# Patient Record
Sex: Female | Born: 1980 | Race: Black or African American | Hispanic: No | Marital: Single | Smoking: Never smoker
Health system: Southern US, Community
[De-identification: ages and names within clinical notes are randomized; demographics above are authoritative.]

---

## 2000-04-03 ENCOUNTER — Emergency Department (HOSPITAL_COMMUNITY): Admission: EM | Admit: 2000-04-03 | Discharge: 2000-04-03 | Payer: Self-pay | Admitting: Emergency Medicine

## 2000-04-03 ENCOUNTER — Encounter: Payer: Self-pay | Admitting: Emergency Medicine

## 2001-06-01 ENCOUNTER — Inpatient Hospital Stay (HOSPITAL_COMMUNITY): Admission: AD | Admit: 2001-06-01 | Discharge: 2001-06-01 | Payer: Self-pay | Admitting: *Deleted

## 2001-07-20 ENCOUNTER — Inpatient Hospital Stay (HOSPITAL_COMMUNITY): Admission: AD | Admit: 2001-07-20 | Discharge: 2001-07-20 | Payer: Self-pay | Admitting: Obstetrics and Gynecology

## 2001-07-20 ENCOUNTER — Encounter: Payer: Self-pay | Admitting: Obstetrics and Gynecology

## 2001-07-21 ENCOUNTER — Inpatient Hospital Stay (HOSPITAL_COMMUNITY): Admission: AD | Admit: 2001-07-21 | Discharge: 2001-07-24 | Payer: Self-pay | Admitting: *Deleted

## 2002-12-14 ENCOUNTER — Other Ambulatory Visit: Admission: RE | Admit: 2002-12-14 | Discharge: 2002-12-14 | Payer: Self-pay | Admitting: Obstetrics and Gynecology

## 2003-01-05 ENCOUNTER — Emergency Department (HOSPITAL_COMMUNITY): Admission: EM | Admit: 2003-01-05 | Discharge: 2003-01-06 | Payer: Self-pay | Admitting: Emergency Medicine

## 2004-09-24 ENCOUNTER — Other Ambulatory Visit: Admission: RE | Admit: 2004-09-24 | Discharge: 2004-09-24 | Payer: Self-pay | Admitting: Obstetrics and Gynecology

## 2005-10-22 ENCOUNTER — Emergency Department (HOSPITAL_COMMUNITY): Admission: EM | Admit: 2005-10-22 | Discharge: 2005-10-22 | Payer: Self-pay | Admitting: Family Medicine

## 2005-12-16 ENCOUNTER — Emergency Department (HOSPITAL_COMMUNITY): Admission: EM | Admit: 2005-12-16 | Discharge: 2005-12-16 | Payer: Self-pay | Admitting: Family Medicine

## 2006-02-16 ENCOUNTER — Other Ambulatory Visit: Admission: RE | Admit: 2006-02-16 | Discharge: 2006-02-16 | Payer: Self-pay | Admitting: Obstetrics and Gynecology

## 2007-05-22 ENCOUNTER — Other Ambulatory Visit: Admission: RE | Admit: 2007-05-22 | Discharge: 2007-05-22 | Payer: Self-pay | Admitting: Gynecology

## 2008-06-10 ENCOUNTER — Ambulatory Visit (HOSPITAL_COMMUNITY): Admission: RE | Admit: 2008-06-10 | Discharge: 2008-06-10 | Payer: Self-pay | Admitting: Obstetrics and Gynecology

## 2008-07-01 ENCOUNTER — Ambulatory Visit (HOSPITAL_COMMUNITY): Admission: RE | Admit: 2008-07-01 | Discharge: 2008-07-01 | Payer: Self-pay | Admitting: Obstetrics and Gynecology

## 2008-11-14 ENCOUNTER — Inpatient Hospital Stay (HOSPITAL_COMMUNITY): Admission: AD | Admit: 2008-11-14 | Discharge: 2008-11-14 | Payer: Self-pay | Admitting: Obstetrics and Gynecology

## 2008-11-14 ENCOUNTER — Emergency Department (HOSPITAL_COMMUNITY): Admission: EM | Admit: 2008-11-14 | Discharge: 2008-11-14 | Payer: Self-pay | Admitting: Family Medicine

## 2008-11-18 ENCOUNTER — Inpatient Hospital Stay (HOSPITAL_COMMUNITY): Admission: RE | Admit: 2008-11-18 | Discharge: 2008-11-20 | Payer: Self-pay | Admitting: Obstetrics and Gynecology

## 2009-10-18 IMAGING — US US OB DETAIL+14 WK
1 series · 14 of 28 positions shown · non-contrast
Comparison: none

OBSTETRICAL ULTRASOUND:
 This ultrasound was performed in The [HOSPITAL], and the [facility] report will be stored to [REDACTED] PACS.

[Series 1: us ob detail+14 wk · 14 of 60 slices shown]
[im 3/60]
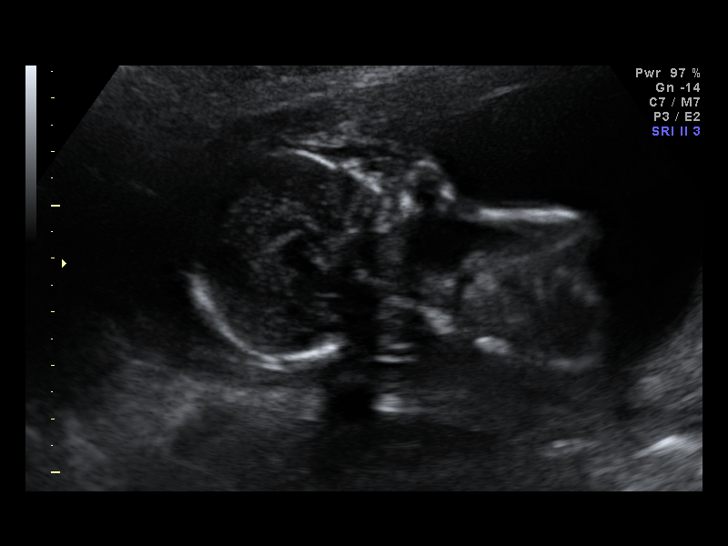
[im 7/60]
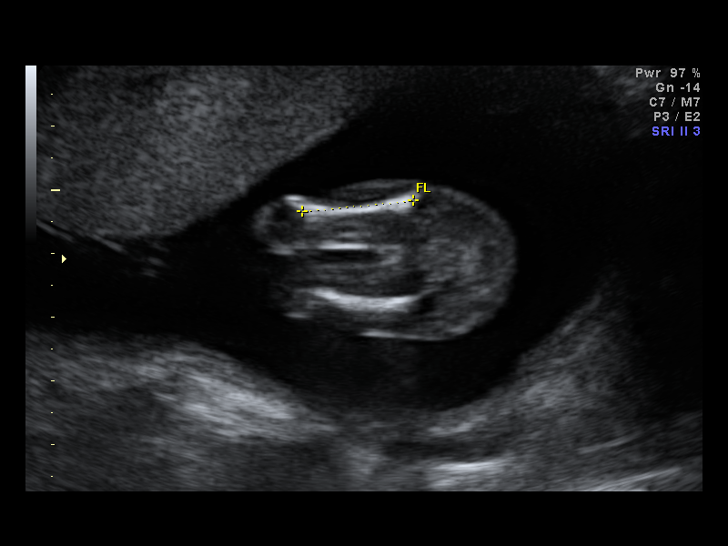
[im 11/60]
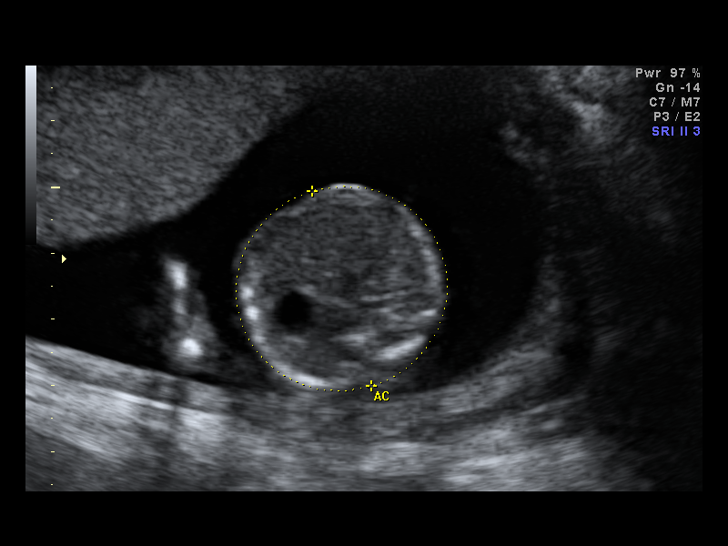
[im 16/60]
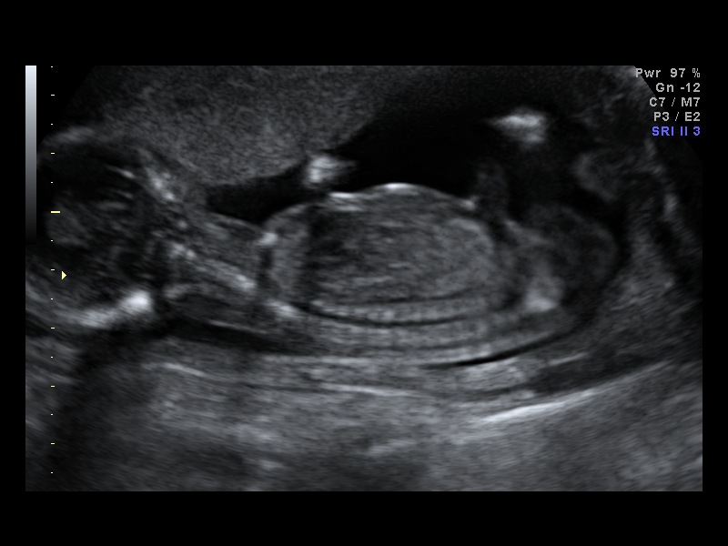
[im 20/60]
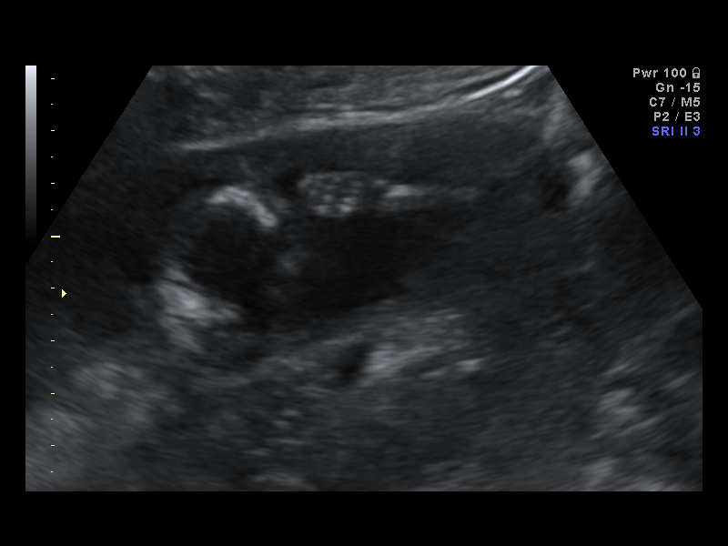
[im 25/60]
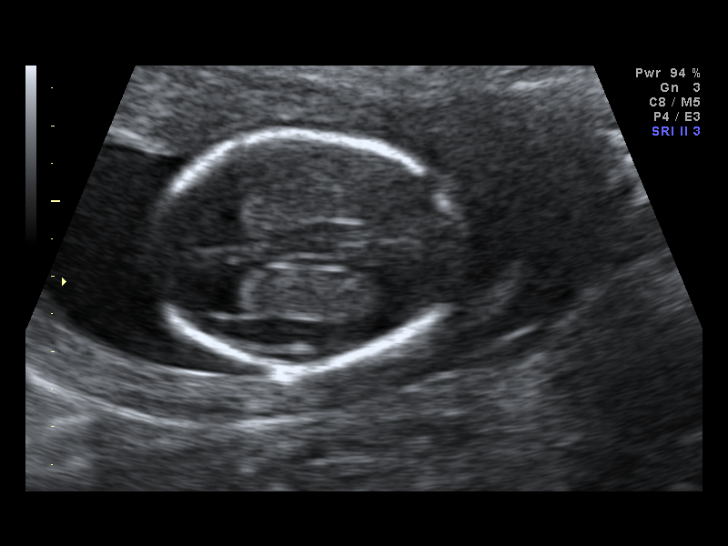
[im 29/60]
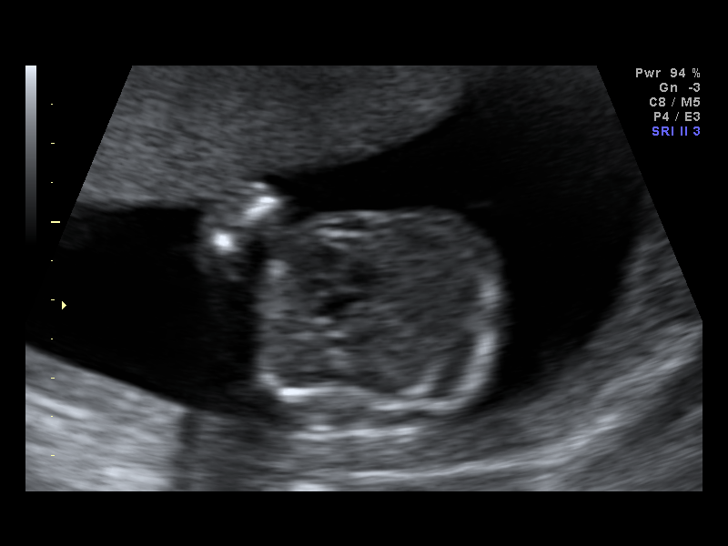
[im 33/60]
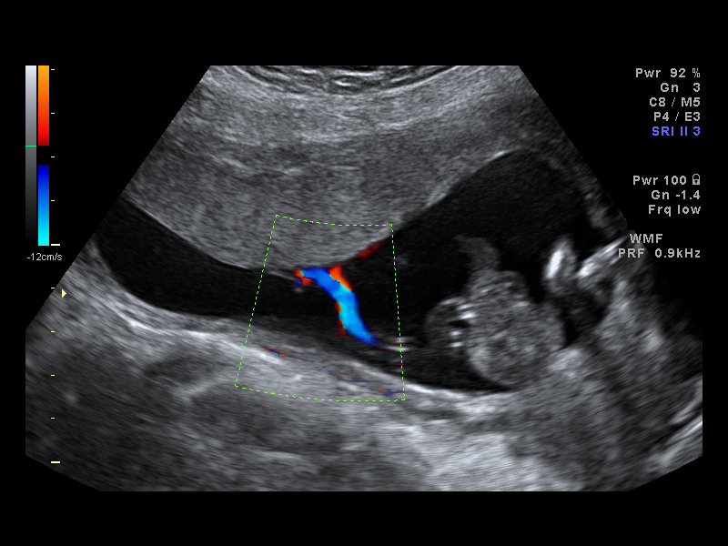
[im 38/60]
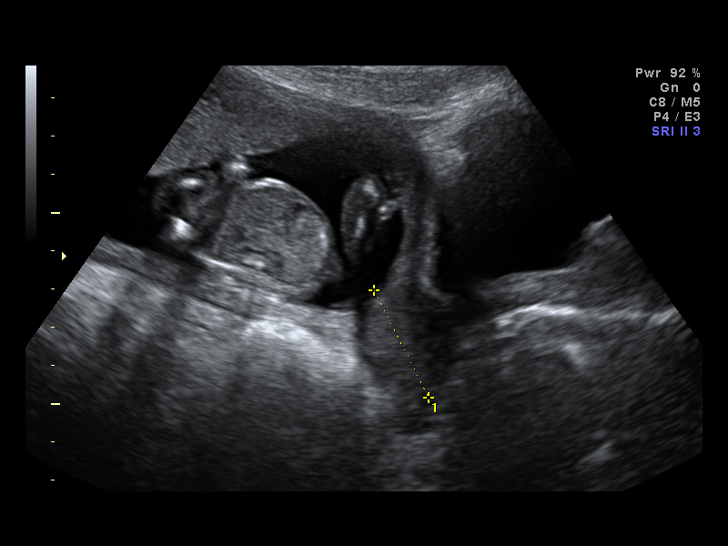
[im 42/60]
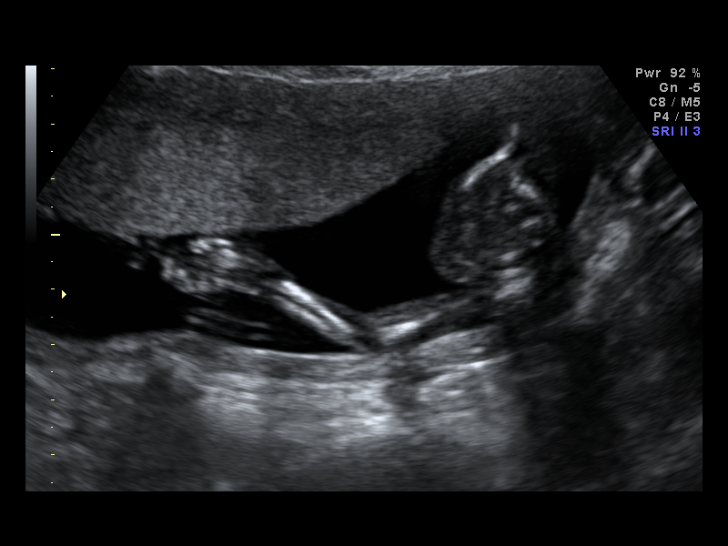
[im 46/60]
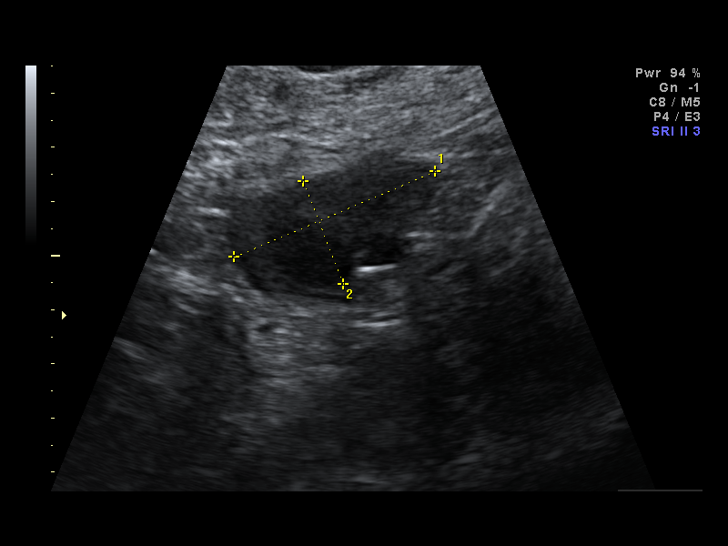
[im 51/60]
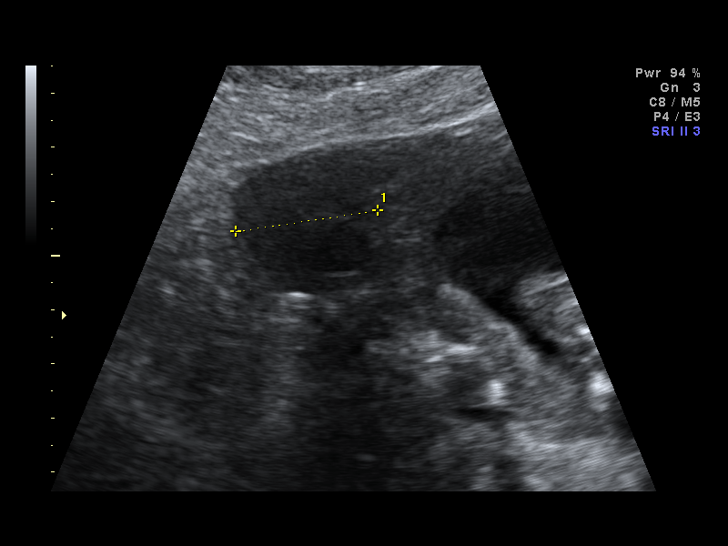
[im 55/60]
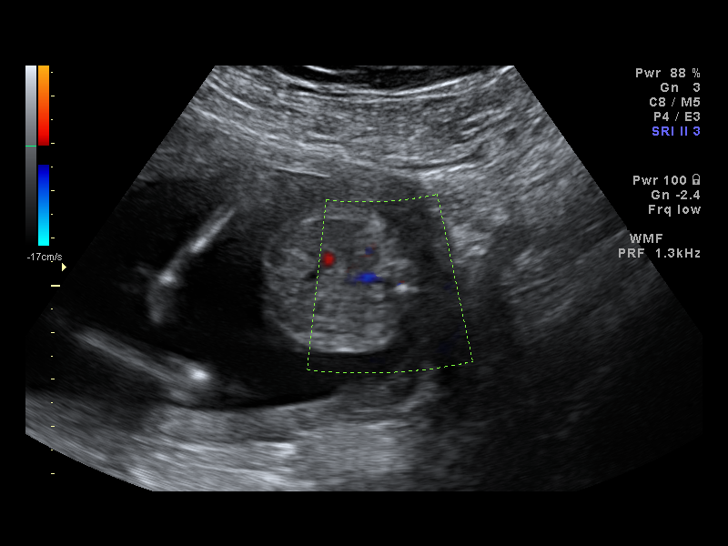
[im 60/60]
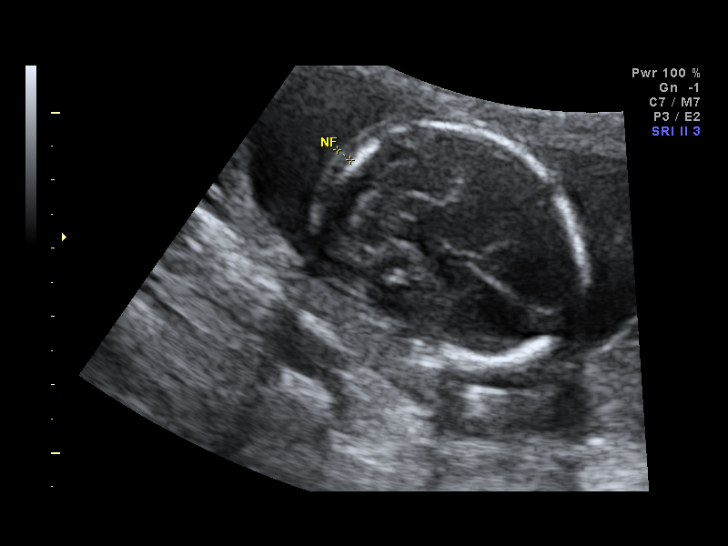

[14 of 28 positions shown; findings below may reference images not displayed]

IMPRESSION: [facility] has also been faxed to the ordering physician.

## 2009-11-20 ENCOUNTER — Emergency Department (HOSPITAL_COMMUNITY): Admission: EM | Admit: 2009-11-20 | Discharge: 2009-11-20 | Payer: Self-pay | Admitting: Family Medicine

## 2010-11-22 NOTE — L&D Delivery Note (Signed)
Delivery Note At 5:05 PM a viable female was delivered via Vaginal, Spontaneous Delivery (Presentation: Left Occiput Transverse).  APGAR: 8, 9; weight 8 lb 14 oz (4026 g).   Placenta status: Intact, Spontaneous.  Cord: 3 vessels with the following complications: None.  Cord pH: N/a  Bilateral labial and perineal abrasions.  Hemostatic.  Anesthesia: Epidural  Episiotomy: None Lacerations: None Suture Repair: N/a Est. Blood Loss (mL): Less than 250 ml  Mom to OR.  Baby to nursery-stable.  Geryl Rankins 10/18/2011, 6:52 PM

## 2010-12-14 ENCOUNTER — Encounter: Payer: Self-pay | Admitting: Obstetrics and Gynecology

## 2011-02-22 LAB — POCT PREGNANCY, URINE: Preg Test, Ur: NEGATIVE

## 2011-02-22 LAB — POCT URINALYSIS DIP (DEVICE)
Glucose, UA: NEGATIVE mg/dL
Nitrite: POSITIVE — AB
Protein, ur: 100 mg/dL — AB
Specific Gravity, Urine: 1.01 (ref 1.005–1.030)
Urobilinogen, UA: >=8 mg/dL (ref 0.0–1.0)
pH: 6 (ref 5.0–8.0)

## 2011-03-22 ENCOUNTER — Other Ambulatory Visit: Payer: Self-pay | Admitting: Obstetrics and Gynecology

## 2011-03-22 ENCOUNTER — Other Ambulatory Visit (HOSPITAL_COMMUNITY)
Admission: RE | Admit: 2011-03-22 | Discharge: 2011-03-22 | Disposition: A | Discharge status: Home / Self Care | Payer: 59 | Source: Ambulatory Visit | Attending: Obstetrics and Gynecology | Admitting: Obstetrics and Gynecology

## 2011-03-22 DIAGNOSIS — Z01419 Encounter for gynecological examination (general) (routine) without abnormal findings: Secondary | ICD-10-CM | POA: Insufficient documentation

## 2011-03-22 DIAGNOSIS — Z113 Encounter for screening for infections with a predominantly sexual mode of transmission: Secondary | ICD-10-CM | POA: Insufficient documentation

## 2011-05-27 LAB — ABO/RH

## 2011-05-27 LAB — GC/CHLAMYDIA PROBE AMP, GENITAL
Chlamydia: NEGATIVE
Gonorrhea: NEGATIVE

## 2011-05-27 LAB — HIV ANTIBODY (ROUTINE TESTING W REFLEX): HIV: NONREACTIVE

## 2011-05-27 LAB — HEPATITIS B SURFACE ANTIGEN: Hepatitis B Surface Ag: NEGATIVE

## 2011-05-27 LAB — RPR: RPR: NONREACTIVE

## 2011-08-26 LAB — CBC
HCT: 33.3 % — ABNORMAL LOW (ref 36.0–46.0)
Hemoglobin: 10.7 g/dL — ABNORMAL LOW (ref 12.0–15.0)
Hemoglobin: 11.3 g/dL — ABNORMAL LOW (ref 12.0–15.0)
MCHC: 34.3 g/dL (ref 30.0–36.0)
MCV: 93.8 fL (ref 78.0–100.0)
RBC: 3.32 MIL/uL — ABNORMAL LOW (ref 3.87–5.11)
RDW: 15.2 % (ref 11.5–15.5)
WBC: 11.8 10*3/uL — ABNORMAL HIGH (ref 4.0–10.5)
WBC: 13.5 10*3/uL — ABNORMAL HIGH (ref 4.0–10.5)

## 2011-10-13 ENCOUNTER — Other Ambulatory Visit: Payer: Self-pay | Admitting: Obstetrics and Gynecology

## 2011-10-17 ENCOUNTER — Encounter (HOSPITAL_COMMUNITY): Payer: Self-pay | Admitting: Pharmacist

## 2011-10-18 ENCOUNTER — Encounter (HOSPITAL_COMMUNITY): Payer: Self-pay | Admitting: Anesthesiology

## 2011-10-18 ENCOUNTER — Inpatient Hospital Stay (HOSPITAL_COMMUNITY): Payer: 59 | Admitting: Anesthesiology

## 2011-10-18 ENCOUNTER — Other Ambulatory Visit: Payer: Self-pay | Admitting: Obstetrics and Gynecology

## 2011-10-18 ENCOUNTER — Encounter (HOSPITAL_COMMUNITY)
Admission: RE | Disposition: A | Discharge status: Home / Self Care | Payer: Self-pay | Source: Ambulatory Visit | Attending: Obstetrics and Gynecology

## 2011-10-18 ENCOUNTER — Encounter (HOSPITAL_COMMUNITY): Payer: Self-pay

## 2011-10-18 ENCOUNTER — Inpatient Hospital Stay (HOSPITAL_COMMUNITY)
Admission: RE | Admit: 2011-10-18 | Discharge: 2011-10-20 | DRG: 767 | Disposition: A | Discharge status: Home / Self Care | Payer: 59 | Source: Ambulatory Visit | Attending: Obstetrics and Gynecology | Admitting: Obstetrics and Gynecology

## 2011-10-18 DIAGNOSIS — O99892 Other specified diseases and conditions complicating childbirth: Principal | ICD-10-CM | POA: Diagnosis present

## 2011-10-18 DIAGNOSIS — Z9851 Tubal ligation status: Secondary | ICD-10-CM

## 2011-10-18 DIAGNOSIS — Z302 Encounter for sterilization: Secondary | ICD-10-CM

## 2011-10-18 DIAGNOSIS — Z22330 Carrier of Group B streptococcus: Secondary | ICD-10-CM

## 2011-10-18 HISTORY — PX: TUBAL LIGATION: SHX77

## 2011-10-18 LAB — CBC
Hemoglobin: 11.3 g/dL — ABNORMAL LOW (ref 12.0–15.0)
MCV: 90.5 fL (ref 78.0–100.0)
Platelets: 189 10*3/uL (ref 150–400)
RBC: 3.67 MIL/uL — ABNORMAL LOW (ref 3.87–5.11)
WBC: 9.9 10*3/uL (ref 4.0–10.5)

## 2011-10-18 SURGERY — LIGATION, FALLOPIAN TUBE, POSTPARTUM
Anesthesia: Epidural | Laterality: Bilateral

## 2011-10-18 MED ORDER — IBUPROFEN 600 MG PO TABS: 600.0000 mg | ORAL_TABLET | Freq: Four times a day (QID) | ORAL | Status: DC | PRN

## 2011-10-18 MED ORDER — OXYCODONE-ACETAMINOPHEN 5-325 MG PO TABS
1.0000 | ORAL_TABLET | ORAL | Status: DC | PRN
Start: 2011-10-18 — End: 2011-10-20
  Administered 2011-10-18 (×2): 1 via ORAL
  Administered 2011-10-19 – 2011-10-20 (×4): 2 via ORAL
  Filled 2011-10-18: qty 1
  Filled 2011-10-18: qty 2
  Filled 2011-10-18: qty 1
  Filled 2011-10-18 (×3): qty 2

## 2011-10-18 MED ORDER — PHENYLEPHRINE 40 MCG/ML (10ML) SYRINGE FOR IV PUSH (FOR BLOOD PRESSURE SUPPORT)
80.0000 ug | PREFILLED_SYRINGE | INTRAVENOUS | Status: DC | PRN
  Filled 2011-10-18: qty 5

## 2011-10-18 MED ORDER — MEASLES, MUMPS & RUBELLA VAC ~~LOC~~ INJ: 0.5000 mL | INJECTION | Freq: Once | SUBCUTANEOUS | Status: DC

## 2011-10-18 MED ORDER — OXYTOCIN 20 UNITS IN LACTATED RINGERS INFUSION - SIMPLE
1.0000 m[IU]/min | INTRAVENOUS | Status: DC
  Administered 2011-10-18: 2 m[IU]/min via INTRAVENOUS
  Administered 2011-10-18: 8 m[IU]/min via INTRAVENOUS
  Filled 2011-10-18: qty 1000

## 2011-10-18 MED ORDER — BENZOCAINE-MENTHOL 20-0.5 % EX AERO: 1.0000 "application " | INHALATION_SPRAY | CUTANEOUS | Status: DC | PRN

## 2011-10-18 MED ORDER — FENTANYL 2.5 MCG/ML BUPIVACAINE 1/10 % EPIDURAL INFUSION (WH - ANES)
14.0000 mL/h | INTRAMUSCULAR | Status: DC
  Administered 2011-10-18 (×2): 14 mL/h via EPIDURAL
  Filled 2011-10-18 (×2): qty 60

## 2011-10-18 MED ORDER — ACETAMINOPHEN 325 MG PO TABS: 325.0000 mg | ORAL_TABLET | ORAL | Status: DC | PRN

## 2011-10-18 MED ORDER — BUPIVACAINE HCL (PF) 0.25 % IJ SOLN
INTRAMUSCULAR | Status: DC | PRN
  Administered 2011-10-18: 10 mL

## 2011-10-18 MED ORDER — LIDOCAINE HCL 1.5 % IJ SOLN
INTRAMUSCULAR | Status: DC | PRN
  Administered 2011-10-18 (×2): 5 mL via EPIDURAL

## 2011-10-18 MED ORDER — ZOLPIDEM TARTRATE 5 MG PO TABS: 5.0000 mg | ORAL_TABLET | Freq: Every evening | ORAL | Status: DC | PRN

## 2011-10-18 MED ORDER — LIDOCAINE HCL (PF) 1 % IJ SOLN: 30.0000 mL | INTRAMUSCULAR | Status: DC | PRN

## 2011-10-18 MED ORDER — PROMETHAZINE HCL 25 MG/ML IJ SOLN: 6.2500 mg | INTRAMUSCULAR | Status: DC | PRN

## 2011-10-18 MED ORDER — TETANUS-DIPHTH-ACELL PERTUSSIS 5-2.5-18.5 LF-MCG/0.5 IM SUSP
0.5000 mL | Freq: Once | INTRAMUSCULAR | Status: AC
  Administered 2011-10-20: 0.5 mL via INTRAMUSCULAR
  Filled 2011-10-18: qty 0.5

## 2011-10-18 MED ORDER — FAMOTIDINE 20 MG PO TABS
20.0000 mg | ORAL_TABLET | Freq: Two times a day (BID) | ORAL | Status: DC
  Administered 2011-10-18 – 2011-10-20 (×3): 20 mg via ORAL
  Filled 2011-10-18 (×3): qty 1

## 2011-10-18 MED ORDER — PROMETHAZINE HCL 25 MG/ML IJ SOLN
INTRAMUSCULAR | Status: AC
  Filled 2011-10-18: qty 1

## 2011-10-18 MED ORDER — EPHEDRINE 5 MG/ML INJ: 10.0000 mg | INTRAVENOUS | Status: DC | PRN

## 2011-10-18 MED ORDER — FLEET ENEMA 7-19 GM/118ML RE ENEM: 1.0000 | ENEMA | RECTAL | Status: DC | PRN

## 2011-10-18 MED ORDER — LACTATED RINGERS IV SOLN
500.0000 mL | Freq: Once | INTRAVENOUS | Status: AC
  Administered 2011-10-18: 500 mL via INTRAVENOUS

## 2011-10-18 MED ORDER — DIPHENHYDRAMINE HCL 25 MG PO CAPS: 25.0000 mg | ORAL_CAPSULE | Freq: Four times a day (QID) | ORAL | Status: DC | PRN

## 2011-10-18 MED ORDER — ONDANSETRON HCL 4 MG/2ML IJ SOLN: 4.0000 mg | INTRAMUSCULAR | Status: DC | PRN

## 2011-10-18 MED ORDER — SODIUM BICARBONATE 8.4 % IV SOLN
INTRAVENOUS | Status: AC
  Filled 2011-10-18: qty 50

## 2011-10-18 MED ORDER — PENICILLIN G POTASSIUM 5000000 UNITS IJ SOLR
2.5000 10*6.[IU] | INTRAVENOUS | Status: DC
  Administered 2011-10-18 (×2): 2.5 10*6.[IU] via INTRAVENOUS
  Filled 2011-10-18 (×5): qty 2.5

## 2011-10-18 MED ORDER — MIDAZOLAM HCL 5 MG/5ML IJ SOLN
INTRAMUSCULAR | Status: DC | PRN
  Administered 2011-10-18 (×2): 1 mg via INTRAVENOUS

## 2011-10-18 MED ORDER — ONDANSETRON HCL 4 MG/2ML IJ SOLN
INTRAMUSCULAR | Status: DC | PRN
  Administered 2011-10-18: 4 mg via INTRAVENOUS

## 2011-10-18 MED ORDER — LACTATED RINGERS IV SOLN
INTRAVENOUS | Status: DC | PRN
  Administered 2011-10-18: 18:00:00 via INTRAVENOUS

## 2011-10-18 MED ORDER — CITRIC ACID-SODIUM CITRATE 334-500 MG/5ML PO SOLN
30.0000 mL | ORAL | Status: DC | PRN
  Administered 2011-10-18: 30 mL via ORAL
  Filled 2011-10-18: qty 15

## 2011-10-18 MED ORDER — OXYTOCIN 20 UNITS IN LACTATED RINGERS INFUSION - SIMPLE: 125.0000 mL/h | INTRAVENOUS | Status: DC | PRN

## 2011-10-18 MED ORDER — KETOROLAC TROMETHAMINE 30 MG/ML IJ SOLN
INTRAMUSCULAR | Status: AC
  Filled 2011-10-18: qty 1

## 2011-10-18 MED ORDER — MEPERIDINE HCL 25 MG/ML IJ SOLN
6.2500 mg | INTRAMUSCULAR | Status: DC | PRN
  Administered 2011-10-18: 6.25 mg via INTRAVENOUS

## 2011-10-18 MED ORDER — LIDOCAINE-EPINEPHRINE (PF) 2 %-1:200000 IJ SOLN
INTRAMUSCULAR | Status: AC
  Filled 2011-10-18: qty 20

## 2011-10-18 MED ORDER — SENNOSIDES-DOCUSATE SODIUM 8.6-50 MG PO TABS
2.0000 | ORAL_TABLET | Freq: Every day | ORAL | Status: DC
  Administered 2011-10-18 – 2011-10-19 (×2): 2 via ORAL

## 2011-10-18 MED ORDER — MEPERIDINE HCL 25 MG/ML IJ SOLN
INTRAMUSCULAR | Status: AC
  Filled 2011-10-18: qty 1

## 2011-10-18 MED ORDER — LACTATED RINGERS IV SOLN
INTRAVENOUS | Status: DC
Start: 2011-10-18 — End: 2011-10-18
  Administered 2011-10-18: 08:00:00 via INTRAVENOUS

## 2011-10-18 MED ORDER — MAGNESIUM HYDROXIDE 400 MG/5ML PO SUSP: 30.0000 mL | ORAL | Status: DC | PRN

## 2011-10-18 MED ORDER — IBUPROFEN 600 MG PO TABS
600.0000 mg | ORAL_TABLET | Freq: Four times a day (QID) | ORAL | Status: DC
  Administered 2011-10-19 – 2011-10-20 (×5): 600 mg via ORAL
  Filled 2011-10-18 (×6): qty 1

## 2011-10-18 MED ORDER — EPHEDRINE 5 MG/ML INJ
10.0000 mg | INTRAVENOUS | Status: DC | PRN
  Filled 2011-10-18: qty 4

## 2011-10-18 MED ORDER — LANOLIN HYDROUS EX OINT: TOPICAL_OINTMENT | CUTANEOUS | Status: DC | PRN

## 2011-10-18 MED ORDER — FERROUS SULFATE 325 (65 FE) MG PO TABS
325.0000 mg | ORAL_TABLET | Freq: Two times a day (BID) | ORAL | Status: DC
  Administered 2011-10-19 – 2011-10-20 (×3): 325 mg via ORAL
  Filled 2011-10-18 (×3): qty 1

## 2011-10-18 MED ORDER — BUPIVACAINE HCL (PF) 0.25 % IJ SOLN
INTRAMUSCULAR | Status: DC | PRN
  Administered 2011-10-18: 10 mL via EPIDURAL

## 2011-10-18 MED ORDER — OXYTOCIN BOLUS FROM INFUSION
500.0000 mL | Freq: Once | INTRAVENOUS | Status: DC
  Filled 2011-10-18: qty 500

## 2011-10-18 MED ORDER — TERBUTALINE SULFATE 1 MG/ML IJ SOLN: 0.2500 mg | Freq: Once | INTRAMUSCULAR | Status: DC | PRN

## 2011-10-18 MED ORDER — SIMETHICONE 80 MG PO CHEW
80.0000 mg | CHEWABLE_TABLET | ORAL | Status: DC | PRN
  Administered 2011-10-20: 80 mg via ORAL

## 2011-10-18 MED ORDER — SODIUM BICARBONATE 8.4 % IV SOLN
INTRAVENOUS | Status: DC | PRN
  Administered 2011-10-18: 10 mL via EPIDURAL

## 2011-10-18 MED ORDER — PRENATAL PLUS 27-1 MG PO TABS
1.0000 | ORAL_TABLET | Freq: Every day | ORAL | Status: DC
  Administered 2011-10-19 – 2011-10-20 (×2): 1 via ORAL
  Filled 2011-10-18 (×2): qty 1

## 2011-10-18 MED ORDER — CEFAZOLIN SODIUM 1-5 GM-% IV SOLN
INTRAVENOUS | Status: DC | PRN
  Administered 2011-10-18: 1 g via INTRAVENOUS

## 2011-10-18 MED ORDER — DIPHENHYDRAMINE HCL 50 MG/ML IJ SOLN: 12.5000 mg | INTRAMUSCULAR | Status: DC | PRN

## 2011-10-18 MED ORDER — DEXTROSE 5 % IV SOLN
5.0000 10*6.[IU] | Freq: Once | INTRAVENOUS | Status: AC
  Administered 2011-10-18: 5 10*6.[IU] via INTRAVENOUS
  Filled 2011-10-18: qty 5

## 2011-10-18 MED ORDER — KETOROLAC TROMETHAMINE 30 MG/ML IJ SOLN
15.0000 mg | Freq: Once | INTRAMUSCULAR | Status: AC | PRN
  Administered 2011-10-18: 30 mg via INTRAVENOUS

## 2011-10-18 MED ORDER — WITCH HAZEL-GLYCERIN EX PADS: 1.0000 "application " | MEDICATED_PAD | CUTANEOUS | Status: DC | PRN

## 2011-10-18 MED ORDER — ONDANSETRON HCL 4 MG PO TABS: 4.0000 mg | ORAL_TABLET | ORAL | Status: DC | PRN

## 2011-10-18 MED ORDER — LACTATED RINGERS IV SOLN: 500.0000 mL | INTRAVENOUS | Status: DC | PRN

## 2011-10-18 MED ORDER — ONDANSETRON HCL 4 MG/2ML IJ SOLN: 4.0000 mg | Freq: Four times a day (QID) | INTRAMUSCULAR | Status: DC | PRN

## 2011-10-18 MED ORDER — METHYLERGONOVINE MALEATE 0.2 MG/ML IJ SOLN: 0.2000 mg | INTRAMUSCULAR | Status: DC | PRN

## 2011-10-18 MED ORDER — MIDAZOLAM HCL 2 MG/2ML IJ SOLN
INTRAMUSCULAR | Status: AC
  Filled 2011-10-18: qty 2

## 2011-10-18 MED ORDER — OXYTOCIN 10 UNIT/ML IJ SOLN
INTRAMUSCULAR | Status: DC | PRN
  Administered 2011-10-18: 20 [IU] via INTRAMUSCULAR

## 2011-10-18 MED ORDER — FENTANYL CITRATE 0.05 MG/ML IJ SOLN
INTRAMUSCULAR | Status: AC
  Administered 2011-10-18: 50 ug
  Filled 2011-10-18: qty 2

## 2011-10-18 MED ORDER — OXYTOCIN 20 UNITS IN LACTATED RINGERS INFUSION - SIMPLE: 125.0000 mL/h | Freq: Once | INTRAVENOUS | Status: DC

## 2011-10-18 MED ORDER — METHYLERGONOVINE MALEATE 0.2 MG PO TABS: 0.2000 mg | ORAL_TABLET | ORAL | Status: DC | PRN

## 2011-10-18 MED ORDER — CEFAZOLIN SODIUM 1-5 GM-% IV SOLN
INTRAVENOUS | Status: AC
  Filled 2011-10-18: qty 50

## 2011-10-18 MED ORDER — DIBUCAINE 1 % RE OINT: 1.0000 "application " | TOPICAL_OINTMENT | RECTAL | Status: DC | PRN

## 2011-10-18 MED ORDER — FENTANYL CITRATE 0.05 MG/ML IJ SOLN: 25.0000 ug | INTRAMUSCULAR | Status: DC | PRN

## 2011-10-18 MED ORDER — ACETAMINOPHEN 325 MG PO TABS: 650.0000 mg | ORAL_TABLET | ORAL | Status: DC | PRN

## 2011-10-18 MED ORDER — PHENYLEPHRINE 40 MCG/ML (10ML) SYRINGE FOR IV PUSH (FOR BLOOD PRESSURE SUPPORT): 80.0000 ug | PREFILLED_SYRINGE | INTRAVENOUS | Status: DC | PRN

## 2011-10-18 MED ORDER — PROMETHAZINE HCL 25 MG/ML IJ SOLN
6.2500 mg | INTRAMUSCULAR | Status: DC | PRN
  Administered 2011-10-18: 6.25 mg via INTRAVENOUS

## 2011-10-18 MED ORDER — OXYCODONE-ACETAMINOPHEN 5-325 MG PO TABS: 2.0000 | ORAL_TABLET | ORAL | Status: DC | PRN

## 2011-10-18 MED ORDER — OXYTOCIN 10 UNIT/ML IJ SOLN
INTRAMUSCULAR | Status: AC
  Filled 2011-10-18: qty 2

## 2011-10-18 SURGICAL SUPPLY — 23 items
CHLORAPREP W/TINT 26ML (MISCELLANEOUS) ×2 IMPLANT
CLOTH BEACON ORANGE TIMEOUT ST (SAFETY) ×2 IMPLANT
DERMABOND ADVANCED (GAUZE/BANDAGES/DRESSINGS) ×1
DERMABOND ADVANCED .7 DNX12 (GAUZE/BANDAGES/DRESSINGS) ×1 IMPLANT
DRAPE UTILITY XL STRL (DRAPES) ×2 IMPLANT
GLOVE BIO SURGEON STRL SZ7 (GLOVE) ×2 IMPLANT
GLOVE BIOGEL PI IND STRL 7.0 (GLOVE) ×2 IMPLANT
GLOVE BIOGEL PI INDICATOR 7.0 (GLOVE) ×2
GOWN PREVENTION PLUS LG XLONG (DISPOSABLE) ×4 IMPLANT
NEEDLE HYPO 22GX1.5 SAFETY (NEEDLE) ×2 IMPLANT
NS IRRIG 1000ML POUR BTL (IV SOLUTION) ×2 IMPLANT
PACK ABDOMINAL MINOR (CUSTOM PROCEDURE TRAY) ×2 IMPLANT
PENCIL BUTTON HOLSTER BLD 10FT (ELECTRODE) ×2 IMPLANT
SLEEVE SCD COMPRESS KNEE LRG (MISCELLANEOUS) ×2 IMPLANT
SPONGE LAP 4X18 X RAY DECT (DISPOSABLE) IMPLANT
SUT MNCRL AB 4-0 PS2 18 (SUTURE) ×4 IMPLANT
SUT PLAIN 0 NONE (SUTURE) ×4 IMPLANT
SUT VIC AB 0 CT1 27 (SUTURE) ×1
SUT VIC AB 0 CT1 27XBRD ANBCTR (SUTURE) ×1 IMPLANT
SYR CONTROL 10ML LL (SYRINGE) ×2 IMPLANT
TOWEL OR 17X24 6PK STRL BLUE (TOWEL DISPOSABLE) ×4 IMPLANT
TRAY FOLEY CATH 14FR (SET/KITS/TRAYS/PACK) ×2 IMPLANT
WATER STERILE IRR 1000ML POUR (IV SOLUTION) ×2 IMPLANT

## 2011-10-18 NOTE — Progress Notes (Signed)
Anne Collins is a 30 y.o. G3P2002 at [redacted]w[redacted]d   Subjective: Pt getting more uncomfortable but does not require pain meds at this time.  Objective: BP 123/66  Pulse 83  Temp(Src) 97.7 F (36.5 C) (Oral)  Ht 5\' 5"  (1.651 m)  Wt 107.502 kg (237 lb)  BMI 39.44 kg/m2      FHT:  FHR: 140s bpm, variability: moderate,  accelerations:  Present,  decelerations:  Absent UC:   regular, every 3-4 minutes SVE:   Dilation: 4.5 Effacement (%): 70 Station: -2 Exam by:: Dr. Dion Body  AROM-minimal fluid Labs: Lab Results  Component Value Date   WBC 9.9 10/18/2011   HGB 11.3* 10/18/2011   HCT 33.2* 10/18/2011   MCV 90.5 10/18/2011   PLT 189 10/18/2011    Assessment / Plan: IOL, s/p AROM Pitocin. S/p 2 doses of PCN. Epidural per pt request.   Geryl Rankins 10/18/2011, 12:47 PM

## 2011-10-18 NOTE — Anesthesia Preprocedure Evaluation (Signed)
Anesthesia Evaluation  Patient identified by MRN, date of birth, ID band Patient awake    Reviewed: Allergy & Precautions, H&P , Patient's Chart, lab work & pertinent test results  Airway Mallampati: III TM Distance: >3 FB Neck ROM: full    Dental No notable dental hx.    Pulmonary neg pulmonary ROS,  clear to auscultation  Pulmonary exam normal       Cardiovascular neg cardio ROS regular Normal    Neuro/Psych Negative Neurological ROS  Negative Psych ROS   GI/Hepatic negative GI ROS, Neg liver ROS,   Endo/Other  Negative Endocrine ROSMorbid obesity  Renal/GU negative Renal ROS     Musculoskeletal   Abdominal   Peds  Hematology negative hematology ROS (+)   Anesthesia Other Findings   Reproductive/Obstetrics (+) Pregnancy                           Anesthesia Physical Anesthesia Plan  ASA: III  Anesthesia Plan: Epidural   Post-op Pain Management:    Induction:   Airway Management Planned:   Additional Equipment:   Intra-op Plan:   Post-operative Plan:   Informed Consent: I have reviewed the patients History and Physical, chart, labs and discussed the procedure including the risks, benefits and alternatives for the proposed anesthesia with the patient or authorized representative who has indicated his/her understanding and acceptance.     Plan Discussed with:   Anesthesia Plan Comments:         Anesthesia Quick Evaluation  

## 2011-10-18 NOTE — Transfer of Care (Signed)
Immediate Anesthesia Transfer of Care Note  Patient: Anne Collins  Procedure(s) Performed:  POST PARTUM TUBAL LIGATION  Patient Location: PACU  Anesthesia Type: Epidural  Level of Consciousness: awake  Airway & Oxygen Therapy: Patient Spontanous Breathing  Post-op Assessment: Report given to PACU RN  Post vital signs: Reviewed and stable  Complications: No apparent anesthesia complications

## 2011-10-18 NOTE — Anesthesia Postprocedure Evaluation (Signed)
  Anesthesia Post-op Note  Patient: Anne Collins   Patient is awake, responsive, moving her legs, and has signs of resolution of her numbness. Pain and nausea are reasonably well controlled. Vital signs are stable and clinically acceptable. Oxygen saturation is clinically acceptable. There are no apparent anesthetic complications at this time. Patient is ready for discharge.

## 2011-10-18 NOTE — Anesthesia Procedure Notes (Addendum)
Epidural Patient location during procedure: OB Start time: 10/18/2011 1:46 PM  Staffing Performed by: anesthesiologist   Preanesthetic Checklist Completed: patient identified, site marked, surgical consent, pre-op evaluation, timeout performed, IV checked, risks and benefits discussed and monitors and equipment checked  Epidural Patient position: sitting Prep: site prepped and draped and DuraPrep Patient monitoring: continuous pulse ox and blood pressure Approach: midline Injection technique: LOR air  Needle:  Needle type: Tuohy  Needle gauge: 17 G Needle length: 9 cm Needle insertion depth: 8 cm Catheter type: closed end flexible Catheter size: 19 Gauge Catheter at skin depth: 13 cm Test dose: negative  Assessment Events: blood not aspirated, injection not painful, no injection resistance, negative IV test and no paresthesia  Additional Notes Patient identified.  Risk benefits discussed including failed block, incomplete pain control, headache, nerve damage, paralysis, blood pressure changes, nausea, vomiting, reactions to medication both toxic or allergic, and postpartum back pain.  Patient expressed understanding and wished to proceed.  All questions were answered.  Sterile technique used throughout procedure and epidural site dressed with sterile barrier dressing. No paresthesia or other complications noted.The patient did not experience any signs of intravascular injection such as tinnitus or metallic taste in mouth nor signs of intrathecal spread such as rapid motor block. Please see nursing notes for vital signs.

## 2011-10-18 NOTE — Progress Notes (Signed)
NDIA SAMPATH is a 30 y.o. G3P2002 at [redacted]w[redacted]d   Subjective: Pt comfortable s/p epidural.   Objective: BP 112/69  Pulse 85  Temp(Src) 97.7 F (36.5 C) (Oral)  Ht 5\' 5"  (1.651 m)  Wt 107.502 kg (237 lb)  BMI 39.44 kg/m2  SpO2 100%      FHT:  FHR: 110s bpm, variability: moderate,  accelerations:  Present,  decelerations:  Present early , possible late UC:   regular, every 2 minutes per RN, difficulty to assess. Pt in right lateral decubitus. SVE:   Dilation: 7 Effacement (%): 90 Station: 0 Exam by:: [employer]  Labs: Lab Results  Component Value Date   WBC 9.9 10/18/2011   HGB 11.3* 10/18/2011   HCT 33.2* 10/18/2011   MCV 90.5 10/18/2011   PLT 189 10/18/2011    Assessment / Plan: Induction of labor due to term with favorable cervix and ,  progressing well on pitocin  Labor: Progressing normally Preeclampsia:  no signs or symptoms of toxicity Fetal Wellbeing:  Category I Pain Control:  Epidural I/D:  PCN Anticipated MOD:  NSVD  Brannon Decaire 10/18/2011, 3:38 PM

## 2011-10-18 NOTE — Progress Notes (Signed)
Anne Collins is a 30 y.o. G3P2002 at [redacted]w[redacted]d admitted for elective induction  Subjective:  Pt without complaints.  Feel contractions.  Feels pressure with contractions.   Objective: BP 114/66  Pulse 78  Temp(Src) 97.7 F (36.5 C) (Oral)  Ht 5\' 5"  (1.651 m)  Wt 107.502 kg (237 lb)  BMI 39.44 kg/m2  LMP 01/22/2011      FHT:  Reactive, no decels UC:   q 3-5 minutes  SVE:   Dilation: 4 Effacement (%): 70 Station: -3 Exam by:: Dr. Dion Body  Cervical exam deferred  Labs: Lab Results  Component Value Date   WBC 9.9 10/18/2011   HGB 11.3* 10/18/2011   HCT 33.2* 10/18/2011   MCV 90.5 10/18/2011   PLT 189 10/18/2011    Assessment / Plan: Induction of labor due to pt request,  progressing well on pitocin  Labor: Progressing normally Preeclampsia:  no signs or symptoms of toxicity Fetal Wellbeing:  Category I Pain Control:  None required at this time I/D:  PCN for GBS prophalaxis Anticipated MOD:  NSVD  [name] 10/18/2011, 10:45 AM

## 2011-10-18 NOTE — H&P (Signed)
Anne Collins is a 30 y.o. female presenting for induction of labor at 59 6/7 weeks.  Pt with occasional contractions but not regular.  Pt requests tubal ligation, preferably to be done prior to discharge. . Maternal Medical History:  Reason for admission: Induction of labor  Contractions: Frequency: irregular.    Fetal activity: Perceived fetal activity is normal.    Prenatal complications: no prenatal complications Prenatal Complications - Diabetes: none.    OB History    Grav Para Term Preterm Abortions TAB SAB Ect Mult Living   3 2 2  0 0 0 0 0 0 2     History reviewed. No pertinent past medical history. History reviewed. No pertinent past surgical history. Family History: family history is not on file. Social History:  reports that she has never smoked. She has never used smokeless tobacco. She reports that she does not drink alcohol or use illicit drugs.  Review of Systems  Gastrointestinal: Positive for abdominal pain.    Dilation: 4 Effacement (%): 70 Station: -3 Exam by:: Dr. Dion Body  Blood pressure 127/75, pulse 102, temperature 97.7 F (36.5 C), temperature source Oral, height 5\' 5"  (1.651 m), weight 107.502 kg (237 lb), last menstrual period 01/22/2011. Maternal Exam:  Uterine Assessment: Contraction strength is mild.  Contraction frequency is irregular.   Abdomen: Estimated fetal weight is 7.5-8 pounds.   Fetal presentation: vertex  Introitus: Normal vulva. Ferning test: not done.  Nitrazine test: not done.  Pelvis: adequate for delivery.   Cervix: Cervix evaluated by digital exam.     Physical Exam  Constitutional: She is oriented to person, place, and time. She appears well-developed and well-nourished.  HENT:  Head: Normocephalic and atraumatic.  Eyes: Conjunctivae and EOM are normal.  Neck: Normal range of motion.  GI: There is no tenderness.  Genitourinary: Uterus normal.  Neurological: She is alert and oriented to person, place, and time.    Skin: Skin is warm and dry.  Psychiatric: She has a normal mood and affect.    Hg 11.7  Prenatal labs: ABO, Rh: O/Positive/-- (07/05 0000) Antibody: Negative (07/05 0000) Rubella: Immune (07/05 0000) RPR: Nonreactive (07/05 0000)  HBsAg: Negative (07/05 0000)  HIV: Non-reactive (07/05 0000)  GBS: Positive (10/26 0000)   Assessment/Plan: Term pregnancy at 39+ weeks for elective induction. IV Pitocin. GBS+ PCN for prophylaxis. Postpartum BTL after delivery.  Need to precert insurance.  Meital Riehl 10/18/2011, 9:02 AM

## 2011-10-18 NOTE — Brief Op Note (Addendum)
10/18/2011  6:54 PM  PATIENT:  Anne Collins  30 y.o. female  PRE-OPERATIVE DIAGNOSIS:  Desires Sterilization  POST-OPERATIVE DIAGNOSIS:  Desires Sterilization  PROCEDURE:  Procedure(s): POST PARTUM TUBAL LIGATION via Engineer, civil (consulting) method  SURGEON:  Surgeon(s): Geryl Rankins, MD  PHYSICIAN ASSISTANT: None  ASSISTANTS: Technician   ANESTHESIA:   local and epidural  EBL:  Total I/O In: 500 [I.V.:500] Out: 300 [Urine:300]  BLOOD ADMINISTERED:none  DRAINS: Urinary Catheter (Foley)   LOCAL MEDICATIONS USED: 0.25% MARCAINE 7 CC  SPECIMEN:  Source of Specimen:  Fallopian tubes.  Bilateral Fallopian tube segments.  DISPOSITION OF SPECIMEN:  PATHOLOGY  COUNTS:  YES  TOURNIQUET:  * No tourniquets in log *  DICTATION: .Other Dictation: Dictation Number [id number]  PLAN OF CARE: To Mother-Baby  PATIENT DISPOSITION:  PACU - hemodynamically stable.   Delay start of Pharmacological VTE agent (>24hrs) due to surgical blood loss or risk of bleeding:  {YES/NO/NOT APPLICABLE:20182

## 2011-10-19 ENCOUNTER — Inpatient Hospital Stay (HOSPITAL_COMMUNITY): Admission: RE | Admit: 2011-10-19 | Payer: Self-pay | Source: Ambulatory Visit | Admitting: Obstetrics and Gynecology

## 2011-10-19 ENCOUNTER — Encounter (HOSPITAL_COMMUNITY): Payer: Self-pay | Admitting: Obstetrics and Gynecology

## 2011-10-19 LAB — CBC
Hemoglobin: 10 g/dL — ABNORMAL LOW (ref 12.0–15.0)
MCH: 31.7 pg (ref 26.0–34.0)
Platelets: 154 10*3/uL (ref 150–400)
RBC: 3.15 MIL/uL — ABNORMAL LOW (ref 3.87–5.11)
WBC: 12.6 10*3/uL — ABNORMAL HIGH (ref 4.0–10.5)

## 2011-10-19 MED ORDER — CEFAZOLIN SODIUM 1-5 GM-% IV SOLN: 1.0000 g | INTRAVENOUS | Status: DC

## 2011-10-19 NOTE — Anesthesia Preprocedure Evaluation (Signed)
Anesthesia Evaluation  Patient identified by MRN, date of birth, ID band Patient awake    Reviewed: Allergy & Precautions, H&P , Patient's Chart, lab work & pertinent test results  Airway Mallampati: III TM Distance: >3 FB Neck ROM: full    Dental No notable dental hx.    Pulmonary neg pulmonary ROS,  clear to auscultation  Pulmonary exam normal       Cardiovascular neg cardio ROS regular Normal    Neuro/Psych Negative Neurological ROS  Negative Psych ROS   GI/Hepatic negative GI ROS, Neg liver ROS,   Endo/Other  Negative Endocrine ROSMorbid obesity  Renal/GU negative Renal ROS     Musculoskeletal   Abdominal   Peds  Hematology negative hematology ROS (+)   Anesthesia Other Findings   Reproductive/Obstetrics                           Anesthesia Physical  Anesthesia Plan  ASA: III  Anesthesia Plan: Epidural   Post-op Pain Management:    Induction:   Airway Management Planned:   Additional Equipment:   Intra-op Plan:   Post-operative Plan:   Informed Consent: I have reviewed the patients History and Physical, chart, labs and discussed the procedure including the risks, benefits and alternatives for the proposed anesthesia with the patient or authorized representative who has indicated his/her understanding and acceptance.     Plan Discussed with:   Anesthesia Plan Comments:         Anesthesia Quick Evaluation

## 2011-10-19 NOTE — Progress Notes (Addendum)
Post Partum Day 1  Subjective: no complaints Breast feeding, bleeding normal.  Pain controlled with Percocet.  Objective: Blood pressure 105/68, pulse 67, temperature 98.4 F (36.9 C), temperature source Oral, resp. rate 18, height 5\' 5"  (1.651 m), weight 107.502 kg (237 lb), last menstrual period 01/22/2011, SpO2 98.00%, unknown if currently breastfeeding.  Physical Exam:  General: alert and cooperative Lochia: Not assessed Uterine Fundus: firm Incision: no significant drainage DVT Evaluation: Calf/Ankle edema is present.   Basename 10/19/11 0525 10/18/11 0754  HGB 10.0* 11.3*  HCT 28.8* 33.2*    Assessment/Plan: Breastfeeding, Lactation consult and Circumcision prior to discharge Pt to pay for circ today.  Pt counseled on R/B/A of circumcision, consent given. Anticipate discharge tomorrow.   LOS: 1 day   Anne Collins 10/19/2011, 6:20 AM

## 2011-10-19 NOTE — Anesthesia Postprocedure Evaluation (Signed)
  Anesthesia Post-op Note  Patient: Anne Collins  Procedure(s) Performed:  POST PARTUM TUBAL LIGATION  Patient Location: Mother/Baby  Anesthesia Type: Epidural  Level of Consciousness: awake  Airway and Oxygen Therapy: Patient Spontanous Breathing  Post-op Pain: none  Post-op Assessment: Post-op Vital signs reviewed  Post-op Vital Signs: Reviewed and stable  Complications: No apparent anesthesia complications

## 2011-10-19 NOTE — Anesthesia Postprocedure Evaluation (Signed)
Anesthesia Post Note  Patient: Anne Collins  Procedure(s) Performed: * No procedures listed *  Anesthesia type: Epidural  Patient location: Mother/Baby  Post pain: Pain level controlled  Post assessment: Post-op Vital signs reviewed  Last Vitals:  Filed Vitals:   10/19/11 0650  BP: 101/69  Pulse: 118  Temp: 36.3 C  Resp: 18    Post vital signs: Reviewed  Level of consciousness: awake  Complications: No apparent anesthesia complications

## 2011-10-19 NOTE — Op Note (Signed)
NAMEJARELY, JUNCAJ NO.:  [id number]  MEDICAL RECORD NO.:  [id number]  LOCATION:  9132                          FACILITY:  [facility]  PHYSICIAN:  Pieter Partridge, MD   DATE OF BIRTH:  [birth year]  DATE OF PROCEDURE:  10/18/2011 DATE OF DISCHARGE:                              OPERATIVE REPORT   PREOPERATIVE DIAGNOSIS:  Desires sterilization status post normal spontaneous vaginal delivery.  POSTOP DIAGNOSIS:  Desires sterilization status post normal spontaneous vaginal delivery.  PROCEDURE:  Postpartum tubal ligation.  SURGEON:  Shela Nevin. Dion Body, MD  ASSISTANT:  Technician.  ANESTHESIA:  Local and epidural, 7 mL of 0.25% Marcaine.  ESTIMATED BLOOD LOSS:  Minimal.  FLUIDS:  500 mL.  URINE OUTPUT:  300 mL.  BLOOD ADMINISTERED:  None.  Foley catheter in place prior to coming to OR.  SPECIMEN:  Bilateral fallopian tube segments to pathology.  The patient to PACU, hemodynamically stable.  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  I suspect there were some small adhesions on the right fallopian tube.  Ovary on the right and left, which was minimally visualized appeared to be normal.  PROCEDURE IN DETAIL:  Ms. Narvaiz was taken to the operating room in the labor and delivery bed.  She was then placed in the dorsal supine position and her epidural catheter was dosed appropriately.  She was prepped and draped in normal sterile fashion.  The abdomen was marked on the periumbilical region, 0.25% Marcaine was injected.  Adequate anesthesia confirmed above the umbilicus.  A periumbilical incision was made with a scalpel.  The skin edges were grasped with the Allis clamps, and the subcutaneous base was dissected sharply with a scalpel and opened bluntly with a Tresa Endo and a [employer].  The fascia was then grasped with the Kocher clamps x2, entered sharply with the Mayo scissors, and the intra-abdominal access was  confirmed.  Peritoneum was incised.  Arm[employer] retractors were then placed in the incision.  Two small laparotomy sponges were placed and tagged in the incision for visualization.  The patient was also placed in Trendelenburg.  The right fallopian tube was identified and was grasped with a Babcock clamp and carried out to the fimbriated end.  Once the fimbriated end was identified, the fallopian tube was grasped and the bilateral tubal ligation was performed via Pomeroy method with 0 plain gut for a suture ligation, one was grasped with the hemostat and the other was tied and cut.  The fallopian tube was then held with the hemostat and the medial salpinx was entered sharply with the Metzenbaum scissors and the fallopian tube segment was removed sharply.  The lumen was cauterized and on that right side, there was a little bit of bleeding that was cauterized with the Bovie.  The fallopian tube was then returned to the abdomen, and the abdomen was then observed for any bleeding.  The same was done on the opposite side.  There were 2 hydatid cyst, 1 was less than a cm and 1 was about 1 cm.  They were stable.  The same procedure was done, that was performed without difficulty, and returned to the abdomen.  I still looked at the pedicle once it returned to the abdomen for any bleeding.  All sponges were removed from the abdomen.  The patient was laid flat. The fascia was previously tagged with a 0 Vicryl.  The fascia was then grasped with Allis clamps and the fascia was closed in a continuous running fashion with 0 Vicryl.  It appeared that the omentum 1 or 2 adhesed up to the abdomen so that was pushed down with a [employer]ec sponge prior to closure.  The skin was then reapproximated with 4-0 Monocryl in a subcuticular fashion and Dermabond was applied to the incision.  The patient tolerated the procedure well.  She was taken to the recovery room in stable condition.     Pieter Partridge,  MD     EBV/MEDQ  D:  10/18/2011  T:  10/19/2011  Job:  [id number]

## 2011-10-20 MED ORDER — INFLUENZA VIRUS VACC SPLIT PF IM SUSP
0.5000 mL | INTRAMUSCULAR | Status: AC | PRN
  Administered 2011-10-20: 0.5 mL via INTRAMUSCULAR
  Filled 2011-10-20: qty 0.5

## 2011-10-20 MED ORDER — OXYCODONE-ACETAMINOPHEN 5-325 MG PO TABS: 1.0000 | ORAL_TABLET | ORAL | Status: AC | PRN

## 2011-10-20 MED ORDER — IBUPROFEN 600 MG PO TABS: 600.0000 mg | ORAL_TABLET | Freq: Four times a day (QID) | ORAL | Status: AC

## 2011-10-20 MED ORDER — PRENATAL PLUS 27-1 MG PO TABS: 1.0000 | ORAL_TABLET | Freq: Every day | ORAL | Status: DC

## 2011-10-20 NOTE — Discharge Summary (Signed)
Obstetric Discharge Summary Reason for Admission: induction of labor Prenatal Procedures: none Intrapartum Procedures: spontaneous vaginal delivery Postpartum Procedures: P.P. tubal ligation Complications-Operative and Postpartum: none Hemoglobin  Date Value Range Status  10/19/2011 10.0* 12.0-15.0 (g/dL) Final     HCT  Date Value Range Status  10/19/2011 28.8* 36.0-46.0 (%) Final    Discharge Diagnoses: Term Pregnancy-delivered  Discharge Information: Date: 10/20/2011 Activity: pelvic rest Diet: routine Medications: PNV, Ibuprofen and Percocet Condition: stable Instructions: See discharge instructions. Discharge to: home Follow-up Information    Follow up with Geryl Rankins, MD on 11/29/2011. (Pt already has appointment)    Contact information:   [address] [place], [place]. [address] [place] [address] [phone]          Newborn Data: Live born female  Birth Weight: 8 lb 14 oz (4026 g) APGAR: 8, 9  Circumcision done on day of discharge.  Home with mother.  Anne Collins 10/20/2011, 9:09 AM

## 2011-10-20 NOTE — Progress Notes (Signed)
Post Partum Day 2, Post op Day 2  Subjective: no complaints  Bleeding minimal.  +Breast feeding.  No h/o postpartum depression.  Pain controlled with Percocet.  Objective: Blood pressure 110/68, pulse 62, temperature 98.2 F (36.8 C), temperature source Oral, resp. rate 19, height 5\' 5"  (1.651 m), weight 107.502 kg (237 lb), last menstrual period 01/22/2011, SpO2 100.00%, unknown if currently breastfeeding.  Physical Exam:  General: alert and cooperative Lochia: Not assessed at perineum Uterine Fundus: firm Incision: healing well DVT Evaluation: No evidence of DVT seen on physical exam.   Basename 10/19/11 0525 10/18/11 0754  HGB 10.0* 11.3*  HCT 28.8* 33.2*    Assessment/Plan: Discharge home and Circumcision prior to discharge-done without complication. S/p postpartum BTL-doing well. F/u in 6 weeks. Post partum depression precautions given.   LOS: 2 days   Estle Sabella 10/20/2011, 8:58 AM

## 2013-09-10 ENCOUNTER — Encounter: Payer: Self-pay | Admitting: Diagnostic Neuroimaging

## 2013-09-10 ENCOUNTER — Ambulatory Visit (INDEPENDENT_AMBULATORY_CARE_PROVIDER_SITE_OTHER): Payer: No Typology Code available for payment source | Admitting: Diagnostic Neuroimaging

## 2013-09-10 ENCOUNTER — Telehealth: Payer: Self-pay

## 2013-09-10 VITALS — BP 121/82 | HR 79 | Temp 98.4°F | Ht 65.5 in | Wt 252.0 lb

## 2013-09-10 DIAGNOSIS — H4710 Unspecified papilledema: Secondary | ICD-10-CM

## 2013-09-10 DIAGNOSIS — R51 Headache: Secondary | ICD-10-CM

## 2013-09-10 DIAGNOSIS — R519 Headache, unspecified: Secondary | ICD-10-CM | POA: Insufficient documentation

## 2013-09-10 NOTE — Progress Notes (Signed)
[facility]  PATIENT: Anne Collins DOB: August 27, 1981  REFERRING CLINICIAN: Leitha Bleak [facility]) HISTORY FROM: patient REASON FOR VISIT: new consult   HISTORICAL  CHIEF COMPLAINT:  Chief Complaint  Patient presents with  . Headache    HISTORY OF PRESENT ILLNESS:   32 year old right-handed female here for evaluation of papilledema.  Over past 3-6 months patient has had increasing frontal and occipital headaches, sometimes associated with sensitivity to light, mild nausea, dizziness. She has had episodes of vision transiently "graying out" lasting for 5 seconds at a time.  No prior history of migraine headaches. She has had gradual weight increase over the past 5 years. Previously she weighed 220 pounds. 6 months ago she was 270 pounds. 2 months ago she reduced 240 pounds. Currently she is 252 pounds.  Patient has been having more problems with sleep and anxiety. There are some psychosocial factors related to her job and family situation.  REVIEW OF SYSTEMS: Full 14 system review of systems performed and notable only for weight gain fatigue blurred vision urination problems headaches dizziness insomnia anxiety not asleep decreased energy racing thoughts.  ALLERGIES: No Known Allergies  HOME MEDICATIONS: Outpatient Prescriptions Prior to Visit  Medication Sig Dispense Refill  . prenatal vitamin w/FE, FA (PRENATAL 1 + 1) 27-1 MG TABS Take 1 tablet by mouth daily.  30 each  12  . ranitidine (ZANTAC) 150 MG tablet Take 150 mg by mouth 2 (two) times daily as needed. For pain        No facility-administered medications prior to visit.    PAST MEDICAL HISTORY: History reviewed. No pertinent past medical history.  PAST SURGICAL HISTORY: Past Surgical History  Procedure Laterality Date  . Tubal ligation  10/18/2011    Procedure: POST PARTUM TUBAL LIGATION;  Surgeon: Geryl Rankins, MD;  Location: [facility];  Service: Gynecology;  Laterality:  Bilateral;    FAMILY HISTORY: History reviewed. No pertinent family history.  SOCIAL HISTORY:  History   Social History  . Marital Status: Single    Spouse Name: N/A    Number of Children: 3  . Years of Education: BSN   Occupational History  . RN     [employer]   Social History Main Topics  . Smoking status: Current Every Day Smoker    Types: Cigars  . Smokeless tobacco: Never Used  . Alcohol Use: Yes     Comment: 1 drink every 3-4 months  . Drug Use: No  . Sexual Activity: Yes   Other Topics Concern  . Not on file   Social History Narrative   Patient lives at home with children.   Caffeine Use: 1 glass daily     PHYSICAL EXAM  Filed Vitals:   09/10/13 0933  BP: 121/82  Pulse: 79  Temp: 98.4 F (36.9 C)  TempSrc: Oral  Height: 5' 5.5" (1.664 m)  Weight: 252 lb (114.306 kg)    Not recorded    Body mass index is 41.28 kg/(m^2).  GENERAL EXAM: Patient is in no distress  CARDIOVASCULAR: Regular rate and rhythm, no murmurs, no carotid bruits  NEUROLOGIC: MENTAL STATUS: awake, alert, language fluent, comprehension intact, naming intact CRANIAL NERVE: OPTIC DISC MARGINS SLIGHTLY BLURRED BILATERALLY, pupils equal and reactive to light, visual fields full to confrontation, extraocular muscles intact, no nystagmus, facial sensation and strength symmetric, uvula midline, shoulder shrug symmetric, tongue midline. MOTOR: normal bulk and tone, full strength in the BUE, BLE SENSORY: normal and symmetric to light  touch, temperature, vibration COORDINATION: finger-nose-finger, fine finger movements normal REFLEXES: deep tendon reflexes present and symmetric GAIT/STATION: narrow based gait; able to walk on toes, heels and tandem; romberg is negative   DIAGNOSTIC DATA (LABS, IMAGING, TESTING) - I reviewed patient records, labs, notes, testing and imaging myself where available.  Lab Results  Component Value Date   WBC 12.6* 10/19/2011   HGB 10.0*  10/19/2011   HCT 28.8* 10/19/2011   MCV 91.4 10/19/2011   PLT 154 10/19/2011   No results found for this basename: na, k, cl, co2, glucose, bun, creatinine, calcium, prot, albumin, ast, alt, alkphos, bilitot, gfrnonaa, gfraa   No results found for this basename: CHOL, HDL, LDLCALC, LDLDIRECT, TRIG, CHOLHDL   No results found for this basename: HGBA1C   No results found for this basename: VITAMINB12   No results found for this basename: TSH     ASSESSMENT AND PLAN  32 y.o. year old female here with intermittent headaches, dizziness, transient visual obscuration, in the setting of weight gain over the past 45 years. Also with mild papilledema on exam. Findings are suspicious for pseudotumor cerebri. Findings and plan reviewed with patient.  PLAN: - MRI brain (rule out secondary causes) - then possible lumbar puncture - weight loss strategies reviewed   Orders Placed This Encounter  Procedures  . MR Brain Wo Contrast    No orders of the defined types were placed in this encounter.   Return in about 3 months (around 12/11/2013).   Suanne Marker, MD 09/10/2013, 10:10 AM Certified in Neurology, Neurophysiology and Neuroimaging  [facility] [address], [address] [place], [place] [address] [phone]

## 2013-09-10 NOTE — Telephone Encounter (Signed)
Message copied by [facility] on Mon Sep 10, 2013  1:34 PM ------      Message from: Joycelyn Schmid      Created: Mon Sep 10, 2013 10:16 AM       pls send my note to [facility]). Also, please request referring notes. -VRP ------

## 2013-09-10 NOTE — Patient Instructions (Signed)
I will order MRI and possibly lumbar puncture.  Start working on gradual weight loss strategies.

## 2013-09-10 NOTE — Telephone Encounter (Signed)
I called and requested the referral notes on this patient. They will be faxed to [phone]. I have faxed a copy Dr. Fonda Kinder' evaluation of this patient to [facility] and the attention of Dr. Abran Cantor at [phone].

## 2013-09-27 ENCOUNTER — Other Ambulatory Visit: Payer: No Typology Code available for payment source

## 2013-10-04 ENCOUNTER — Other Ambulatory Visit: Payer: No Typology Code available for payment source

## 2013-12-05 ENCOUNTER — Encounter: Payer: Self-pay | Admitting: Diagnostic Neuroimaging

## 2013-12-05 ENCOUNTER — Telehealth: Payer: Self-pay

## 2013-12-05 NOTE — Telephone Encounter (Signed)
Tried to reach patient to reschedule appt for 12/11/13. Not showing MRI completed as directed in last OV note. Will try later.

## 2013-12-11 ENCOUNTER — Ambulatory Visit: Payer: No Typology Code available for payment source | Admitting: Diagnostic Neuroimaging

## 2014-09-23 ENCOUNTER — Encounter: Payer: Self-pay | Admitting: Diagnostic Neuroimaging

## 2014-10-08 ENCOUNTER — Encounter (HOSPITAL_COMMUNITY): Payer: Self-pay | Admitting: Emergency Medicine

## 2014-10-08 ENCOUNTER — Emergency Department (INDEPENDENT_AMBULATORY_CARE_PROVIDER_SITE_OTHER)
Admission: EM | Admit: 2014-10-08 | Discharge: 2014-10-08 | Disposition: A | Discharge status: Home / Self Care | Payer: No Typology Code available for payment source | Source: Home / Self Care | Attending: Emergency Medicine | Admitting: Emergency Medicine

## 2014-10-08 DIAGNOSIS — N3000 Acute cystitis without hematuria: Secondary | ICD-10-CM

## 2014-10-08 LAB — POCT URINALYSIS DIP (DEVICE)
BILIRUBIN URINE: NEGATIVE
Glucose, UA: NEGATIVE mg/dL
KETONES UR: NEGATIVE mg/dL
Nitrite: POSITIVE — AB
PH: 7 (ref 5.0–8.0)
PROTEIN: NEGATIVE mg/dL
Specific Gravity, Urine: 1.025 (ref 1.005–1.030)
Urobilinogen, UA: 0.2 mg/dL (ref 0.0–1.0)

## 2014-10-08 LAB — POCT PREGNANCY, URINE: Preg Test, Ur: NEGATIVE

## 2014-10-08 MED ORDER — PHENAZOPYRIDINE HCL 200 MG PO TABS: 200.0000 mg | ORAL_TABLET | Freq: Three times a day (TID) | ORAL | Status: DC | PRN

## 2014-10-08 MED ORDER — CEPHALEXIN 500 MG PO CAPS: 500.0000 mg | ORAL_CAPSULE | Freq: Three times a day (TID) | ORAL | Status: DC

## 2014-10-08 NOTE — ED Notes (Signed)
Pt states she has had minimal symptoms for one week, but she has a long history of UTI's

## 2014-10-08 NOTE — Discharge Instructions (Signed)

## 2014-10-08 NOTE — ED Provider Notes (Signed)
Chief Complaint   Urinary Tract Infection   History of Present Illness   Anne LawlessMonishia S Dimas AguasHoward is a 33 year old female who's had a one-week history of dysuria, urinary frequency, urgency, malodorous urine, and dark-colored urine. She denies any abdominal pain but does have some lower back pain. She denies fever, chills, nausea, or vomiting. She's had no GYN symptoms. She's had multiple UTIs in the past. The last one was a year and a half ago.  Review of Systems   Other than as noted above, the patient denies any of the following symptoms: General:  No fevers or chills. GI:  No abdominal pain, back pain, nausea, or vomiting. GU:  No hematuria or incontinence. GYN:  No discharge, itching, vulvar pain or lesions, pelvic pain, or abnormal vaginal bleeding.  PMFSH   Past medical history, family history, social history, meds, and allergies were reviewed.    Physical Examination     Vital signs:  BP 125/90 mmHg  Pulse 91  Temp(Src) 97.3 F (36.3 C) (Oral)  Resp 16  SpO2 94%  LMP 09/17/2014 (Approximate) Gen:  Alert, oriented, in no distress. Lungs:  Clear to auscultation, no wheezes, rales or rhonchi. Heart:  Regular rhythm, no gallop or murmer. Abdomen:  Flat and soft. There was slight suprapubic pain to palpation.  No guarding, or rebound.  No hepato-splenomegaly or mass.  Bowel sounds were normally active.  No hernia. Back:  No CVA tenderness.  Skin:  Clear, warm and dry.  Labs   Results for orders placed or performed during the hospital encounter of 10/08/14  POCT urinalysis dip (device)  Result Value Ref Range   Glucose, UA NEGATIVE NEGATIVE mg/dL   Bilirubin Urine NEGATIVE NEGATIVE   Ketones, ur NEGATIVE NEGATIVE mg/dL   Specific Gravity, Urine 1.025 1.005 - 1.030   Hgb urine dipstick TRACE (A) NEGATIVE   pH 7.0 5.0 - 8.0   Protein, ur NEGATIVE NEGATIVE mg/dL   Urobilinogen, UA 0.2 0.0 - 1.0 mg/dL   Nitrite POSITIVE (A) NEGATIVE   Leukocytes, UA SMALL (A) NEGATIVE   Pregnancy, urine POC  Result Value Ref Range   Preg Test, Ur NEGATIVE NEGATIVE     A urine culture was obtained.  Results are pending at this time and we will call about any positive results.  Assessment   The encounter diagnosis was Acute cystitis without hematuria.   No evidence of pyelonephritis.    Plan   1.  Meds:  The following meds were prescribed:   Discharge Medication List as of 10/08/2014 10:57 AM    START taking these medications   Details  cephALEXin (KEFLEX) 500 MG capsule Take 1 capsule (500 mg total) by mouth 3 (three) times daily., Starting 10/08/2014, Until Discontinued, Normal    phenazopyridine (PYRIDIUM) 200 MG tablet Take 1 tablet (200 mg total) by mouth 3 (three) times daily as needed for pain., Starting 10/08/2014, Until Discontinued, Normal        2.  Patient Education/Counseling:  The patient was given appropriate handouts, self care instructions, and instructed in symptomatic relief. The patient was told to avoid intercourse for 10 days, get extra fluids, and return for a follow up with her primary care doctor at the completion of treatment for a repeat UA and culture.    3.  Follow up:  The patient was told to follow up here if no better in 3 to 4 days, or sooner if becoming worse in any way, and given some red flag symptoms such as fever,  persistent vomiting, or severe flank or abdominal pain which would prompt immediate return.     Reuben Likesavid C Anuradha Chabot, MD 10/08/14 [phone]

## 2014-10-11 LAB — URINE CULTURE: Special Requests: NORMAL

## 2014-10-12 NOTE — Progress Notes (Signed)
Quick Note:  Results are abnormal as noted, but have been adequately treated. No further action necessary. ______ 

## 2014-10-13 NOTE — ED Notes (Signed)
Urine culture: >100,000 colonies E. Coli.  Pt. adequately treated with Keflex. Anne Collins, Anne Collins 10/13/2014

## 2014-12-06 ENCOUNTER — Emergency Department (INDEPENDENT_AMBULATORY_CARE_PROVIDER_SITE_OTHER)
Admission: EM | Admit: 2014-12-06 | Discharge: 2014-12-06 | Disposition: A | Discharge status: Home / Self Care | Payer: No Typology Code available for payment source | Source: Home / Self Care | Attending: Family Medicine | Admitting: Family Medicine

## 2014-12-06 ENCOUNTER — Encounter (HOSPITAL_COMMUNITY): Payer: Self-pay | Admitting: Emergency Medicine

## 2014-12-06 DIAGNOSIS — S025XXA Fracture of tooth (traumatic), initial encounter for closed fracture: Secondary | ICD-10-CM

## 2014-12-06 MED ORDER — HYDROCODONE-ACETAMINOPHEN 5-325 MG PO TABS: 1.0000 | ORAL_TABLET | Freq: Four times a day (QID) | ORAL | Status: AC | PRN

## 2014-12-06 MED ORDER — BUPIVACAINE-EPINEPHRINE (PF) 0.5% -1:200000 IJ SOLN
INTRAMUSCULAR | Status: AC
  Filled 2014-12-06: qty 1.8

## 2014-12-06 MED ORDER — CLINDAMYCIN HCL 300 MG PO CAPS: 300.0000 mg | ORAL_CAPSULE | Freq: Three times a day (TID) | ORAL | Status: AC

## 2014-12-06 MED ORDER — DICLOFENAC SODIUM 50 MG PO TBEC: 50.0000 mg | DELAYED_RELEASE_TABLET | Freq: Two times a day (BID) | ORAL | Status: AC | PRN

## 2014-12-06 NOTE — ED Provider Notes (Signed)
Anne GivensMonishia S Collins is a 34 y.o. female who presents to Urgent Care today for tooth pain. Patient has a broken tooth in her upper left incisor. Tooth broke 2 days ago and the pain became severe this morning. The pain is associated with left facial swelling. She's tried over-the-counter medications which did not help. No fevers or chills. Patient has an appointment with her dentist on Monday.   History reviewed. No pertinent past medical history. Past Surgical History  Procedure Laterality Date  . Tubal ligation  10/18/2011    Procedure: POST PARTUM TUBAL LIGATION;  Surgeon: Geryl RankinsEvelyn Varnado, MD;  Location: [facility];  Service: Gynecology;  Laterality: Bilateral;   History  Substance Use Topics  . Smoking status: Never Smoker   . Smokeless tobacco: Never Used  . Alcohol Use: Yes     Comment: 1 drink every 3-4 months   ROS as above Medications: No current facility-administered medications for this encounter.   Current Outpatient Prescriptions  Medication Sig Dispense Refill  . clindamycin (CLEOCIN) 300 MG capsule Take 1 capsule (300 mg total) by mouth 3 (three) times daily. 30 capsule 0  . diclofenac (VOLTAREN) 50 MG EC tablet Take 1 tablet (50 mg total) by mouth 2 (two) times daily as needed. 60 tablet 0  . HYDROcodone-acetaminophen (NORCO/VICODIN) 5-325 MG per tablet Take 1 tablet by mouth every 6 (six) hours as needed. 15 tablet 0   No Known Allergies   Exam:  BP 137/84 mmHg  Pulse 92  Temp(Src) 98.2 F (36.8 C) (Oral)  Resp 16  SpO2 100% Gen: Well NAD HEENT: EOMI,  MMM broken upper left lateral incisor. Tender to touch. No significant gumline erythema. Moderate left facial swelling.  Dental injection: Consent obtained Topical numbing medicine applied to the base of the tooth 1.8 mL of Marcaine and epinephrine were injected into the base of the tooth at the junction of the gum and cheek achieving good anesthesia. Patient tolerated procedure well.   No results found for this or  any previous visit (from the past 24 hour(s)). No results found.  Assessment and Plan: 34 y.o. female with a broken tooth and dental pain. Status post injection as above. Associated with some facial swelling. Treat with clindamycin diclofenac and Norco. Follow-up with dentist.  Discussed warning signs or symptoms. Please see discharge instructions. Patient expresses understanding.     Rodolph BongEvan S Cataleia Gade, MD 12/06/14 [phone]

## 2014-12-06 NOTE — Discharge Instructions (Signed)
Thank you for coming in today.  Dental Fracture You have a dental fracture or injury. This can mean the tooth is loose, has a chip in the enamel or is broken. If just the outer enamel is chipped, there is a good chance the tooth will not become infected. The only treatment needed may be to smooth off a rough edge. Fractures into the deeper layers (dentin and pulp) cause greater pain and are more likely to become infected. These require you to see a dentist as soon as possible to save the tooth. Loose teeth may need to be wired or bonded with a plastic splint to hold them in place. A paste may be painted on the open area of the broken tooth to reduce the pain. Antibiotics and pain medicine may be prescribed. Choosing a soft or liquid diet and rinsing the mouth out with warm water after meals may be helpful. See your dentist as recommended. Failure to seek care or follow up with a dentist or other specialist as recommended could result in the loss of your tooth, infection, or permanent dental problems. SEEK MEDICAL CARE IF:   You have increased pain not controlled with medicines.  You have swelling around the tooth, in the face or neck.  You have bleeding which starts, continues, or gets worse.  You have a fever. Document Released: 12/16/2004 Document Revised: 01/31/2012 Document Reviewed: 09/30/2009 [facility] Patient Information 2015 [employer] [place]. This information is not intended to replace advice given to you by your health care provider. Make sure you discuss any questions you have with your health care provider.  [facility]:  [facility] [phone]- [phone] (ext [phone])  [phone]  Please call Dr. Lawrence Marseillesivils office [phone] or cell [phone] [address], [place]  Cost for tooth removal $200 includes exam, Xray, and extraction and follow up visit.  Bring list of current medications with you.   [facility] - [phone][phone]  [facility] [phone]-  [phone]  [address]  [facility]  [address], [place], [place], [id number]  [phone], Ext. [phone]  2nd and 4th Thursday of the month at 6:30am (Simple extractions only - no wisdom teeth or surgery) First come/First serve -First 10 clients served  [facility] [place] residents only)  [address], [place], [place], [address]  [address] [phone]  [facility]  [phone]  [facility]  [phone]  [facility]  [phone]  Please call [facility] at [phone] to get the details to get your tooth pulled.

## 2014-12-06 NOTE — ED Notes (Signed)
C/o dental pain/broken tooth onset 2 days Alert, no signs of acute distress.

## 2014-12-06 NOTE — ED Notes (Signed)
Bed: UC06 Expected date:  Expected time:  Means of arrival:  Comments: hold 

## 2015-09-08 ENCOUNTER — Other Ambulatory Visit: Payer: Self-pay | Admitting: Family Medicine

## 2015-09-08 ENCOUNTER — Other Ambulatory Visit (HOSPITAL_COMMUNITY)
Admission: RE | Admit: 2015-09-08 | Discharge: 2015-09-08 | Disposition: A | Discharge status: Home / Self Care | Payer: No Typology Code available for payment source | Source: Ambulatory Visit | Attending: Family Medicine | Admitting: Family Medicine

## 2015-09-08 DIAGNOSIS — Z1151 Encounter for screening for human papillomavirus (HPV): Secondary | ICD-10-CM | POA: Insufficient documentation

## 2015-09-08 DIAGNOSIS — Z124 Encounter for screening for malignant neoplasm of cervix: Secondary | ICD-10-CM | POA: Insufficient documentation

## 2015-09-09 LAB — CYTOLOGY - PAP

## 2017-09-01 DIAGNOSIS — N61 Mastitis without abscess: Secondary | ICD-10-CM | POA: Diagnosis not present

## 2017-12-25 DIAGNOSIS — B349 Viral infection, unspecified: Secondary | ICD-10-CM | POA: Diagnosis not present

## 2018-01-25 DIAGNOSIS — Z0000 Encounter for general adult medical examination without abnormal findings: Secondary | ICD-10-CM | POA: Diagnosis not present

## 2018-01-25 DIAGNOSIS — R5383 Other fatigue: Secondary | ICD-10-CM | POA: Diagnosis not present

## 2018-01-25 DIAGNOSIS — E785 Hyperlipidemia, unspecified: Secondary | ICD-10-CM | POA: Diagnosis not present

## 2018-01-25 DIAGNOSIS — E559 Vitamin D deficiency, unspecified: Secondary | ICD-10-CM | POA: Diagnosis not present

## 2018-05-22 DIAGNOSIS — R300 Dysuria: Secondary | ICD-10-CM | POA: Diagnosis not present

## 2018-05-22 DIAGNOSIS — E559 Vitamin D deficiency, unspecified: Secondary | ICD-10-CM | POA: Diagnosis not present
